# Patient Record
Sex: Female | Born: 1965 | Race: White | Hispanic: No | Smoking: Never smoker
Health system: Southern US, Community
[De-identification: ages and names within clinical notes are randomized; demographics above are authoritative.]

## PROBLEM LIST (undated history)

## (undated) DIAGNOSIS — Q059 Spina bifida, unspecified: Secondary | ICD-10-CM

## (undated) DIAGNOSIS — K219 Gastro-esophageal reflux disease without esophagitis: Secondary | ICD-10-CM

## (undated) DIAGNOSIS — F319 Bipolar disorder, unspecified: Secondary | ICD-10-CM

## (undated) DIAGNOSIS — I4891 Unspecified atrial fibrillation: Secondary | ICD-10-CM

## (undated) DIAGNOSIS — I10 Essential (primary) hypertension: Secondary | ICD-10-CM

---

## 2018-10-26 ENCOUNTER — Emergency Department (HOSPITAL_COMMUNITY)
Admission: EM | Admit: 2018-10-26 | Discharge: 2018-10-26 | Disposition: A | Discharge status: Home / Self Care | Payer: Medicare HMO | Attending: Emergency Medicine | Admitting: Emergency Medicine

## 2018-10-26 ENCOUNTER — Other Ambulatory Visit: Payer: Self-pay

## 2018-10-26 ENCOUNTER — Emergency Department (HOSPITAL_COMMUNITY): Payer: Medicare HMO

## 2018-10-26 ENCOUNTER — Encounter (HOSPITAL_COMMUNITY): Payer: Self-pay | Admitting: *Deleted

## 2018-10-26 DIAGNOSIS — R29810 Facial weakness: Secondary | ICD-10-CM | POA: Diagnosis present

## 2018-10-26 DIAGNOSIS — I10 Essential (primary) hypertension: Secondary | ICD-10-CM | POA: Diagnosis not present

## 2018-10-26 DIAGNOSIS — G510 Bell's palsy: Secondary | ICD-10-CM | POA: Diagnosis not present

## 2018-10-26 DIAGNOSIS — D649 Anemia, unspecified: Secondary | ICD-10-CM | POA: Diagnosis not present

## 2018-10-26 HISTORY — DX: Spina bifida, unspecified: Q05.9

## 2018-10-26 HISTORY — DX: Gastro-esophageal reflux disease without esophagitis: K21.9

## 2018-10-26 HISTORY — DX: Unspecified atrial fibrillation: I48.91

## 2018-10-26 HISTORY — DX: Essential (primary) hypertension: I10

## 2018-10-26 HISTORY — DX: Bipolar disorder, unspecified: F31.9

## 2018-10-26 LAB — I-STAT CHEM 8, ED
BUN: 35 mg/dL — ABNORMAL HIGH (ref 6–20)
Calcium, Ion: 1.08 mmol/L — ABNORMAL LOW (ref 1.15–1.40)
Chloride: 106 mmol/L (ref 98–111)
Creatinine, Ser: 1.7 mg/dL — ABNORMAL HIGH (ref 0.44–1.00)
Glucose, Bld: 118 mg/dL — ABNORMAL HIGH (ref 70–99)
HCT: 27 % — ABNORMAL LOW (ref 36.0–46.0)
Hemoglobin: 9.2 g/dL — ABNORMAL LOW (ref 12.0–15.0)
Potassium: 4.1 mmol/L (ref 3.5–5.1)
Sodium: 138 mmol/L (ref 135–145)
TCO2: 24 mmol/L (ref 22–32)

## 2018-10-26 LAB — COMPREHENSIVE METABOLIC PANEL
ALT: 12 U/L (ref 0–44)
AST: 15 U/L (ref 15–41)
Albumin: 3.4 g/dL — ABNORMAL LOW (ref 3.5–5.0)
Alkaline Phosphatase: 95 U/L (ref 38–126)
Anion gap: 11 (ref 5–15)
BUN: 37 mg/dL — ABNORMAL HIGH (ref 6–20)
CO2: 21 mmol/L — ABNORMAL LOW (ref 22–32)
Calcium: 8.4 mg/dL — ABNORMAL LOW (ref 8.9–10.3)
Chloride: 103 mmol/L (ref 98–111)
Creatinine, Ser: 1.68 mg/dL — ABNORMAL HIGH (ref 0.44–1.00)
GFR calc Af Amer: 40 mL/min — ABNORMAL LOW (ref >60)
GFR calc non Af Amer: 35 mL/min — ABNORMAL LOW (ref >60)
Glucose, Bld: 120 mg/dL — ABNORMAL HIGH (ref 70–99)
Potassium: 4 mmol/L (ref 3.5–5.1)
Sodium: 135 mmol/L (ref 135–145)
Total Bilirubin: 0.4 mg/dL (ref 0.3–1.2)
Total Protein: 6.8 g/dL (ref 6.5–8.1)

## 2018-10-26 LAB — DIFFERENTIAL
Abs Immature Granulocytes: 0.02 10*3/uL (ref 0.00–0.07)
Basophils Absolute: 0 10*3/uL (ref 0.0–0.1)
Basophils Relative: 1 %
Eosinophils Absolute: 0.4 10*3/uL (ref 0.0–0.5)
Eosinophils Relative: 5 %
Immature Granulocytes: 0 %
Lymphocytes Relative: 22 %
Lymphs Abs: 1.8 10*3/uL (ref 0.7–4.0)
Monocytes Absolute: 0.6 10*3/uL (ref 0.1–1.0)
Monocytes Relative: 7 %
Neutro Abs: 5.3 10*3/uL (ref 1.7–7.7)
Neutrophils Relative %: 65 %

## 2018-10-26 LAB — CBC
HCT: 27.9 % — ABNORMAL LOW (ref 36.0–46.0)
Hemoglobin: 8.7 g/dL — ABNORMAL LOW (ref 12.0–15.0)
MCH: 28.9 pg (ref 26.0–34.0)
MCHC: 31.2 g/dL (ref 30.0–36.0)
MCV: 92.7 fL (ref 80.0–100.0)
Platelets: 352 10*3/uL (ref 150–400)
RBC: 3.01 MIL/uL — ABNORMAL LOW (ref 3.87–5.11)
RDW: 15.6 % — ABNORMAL HIGH (ref 11.5–15.5)
WBC: 8.2 10*3/uL (ref 4.0–10.5)
nRBC: 0 % (ref 0.0–0.2)

## 2018-10-26 LAB — PROTIME-INR
INR: 0.9 (ref 0.8–1.2)
Prothrombin Time: 12.3 seconds (ref 11.4–15.2)

## 2018-10-26 LAB — APTT: aPTT: 30 seconds (ref 24–36)

## 2018-10-26 LAB — I-STAT BETA HCG BLOOD, ED (MC, WL, AP ONLY): I-stat hCG, quantitative: <5 m[IU]/mL (ref <5)

## 2018-10-26 MED ORDER — PREDNISONE 20 MG PO TABS: 60.0000 mg | ORAL_TABLET | Freq: Every day | ORAL | 0 refills | Status: AC

## 2018-10-26 MED ORDER — SODIUM CHLORIDE 0.9% FLUSH: 3.0000 mL | Freq: Once | INTRAVENOUS | Status: DC

## 2018-10-26 MED ORDER — VALACYCLOVIR HCL 1 G PO TABS: 1000.0000 mg | ORAL_TABLET | Freq: Three times a day (TID) | ORAL | 0 refills | Status: AC

## 2018-10-26 NOTE — Discharge Instructions (Addendum)
It was my pleasure taking care of you today!   As we discussed, using artificial tears 4 times a day to keep your eye moist well help.  You can also take the eyelid shut as we discussed during sleep with paper tape.  Take prednisone and valtrex as directed.   Please call your primary care doctor on Monday morning.  Let them know that your hemoglobin was 8.9.  Please arrange for a follow-up appointment.   Return to the emergency department for new or worsening symptoms, any additional concerns.

## 2018-10-26 NOTE — ED Notes (Signed)
Patient transported to MRI 

## 2018-10-26 NOTE — ED Notes (Signed)
Patient verbalizes understanding of discharge instructions. Opportunity for questioning and answers were provided. Armband removed by staff, pt discharged from ED.  

## 2018-10-26 NOTE — ED Triage Notes (Signed)
Pt reports waking up this am with right side facial droop. Unable to close right eye. Grips are equal, no other neuro deficits noted and denies headache.

## 2018-10-26 NOTE — ED Provider Notes (Signed)
[facility]   CSN: [id number] Arrival date & time: 10/26/18  1948    History   Chief Complaint Chief Complaint  Patient presents with  . Facial Droop    HPI Jackie Perez is a 53 y.o. female.     The history is provided by the patient and medical records. No language interpreter was used.   Jackie Perez is a 53 y.o. female  with a PMH of afib not on anticoagulation or aspirin, spina bifida, HTN who presents to the Emergency Department for evaluation after waking up this morning around 8:30 AM and noticing that she could not close her eye completely.  She then looked in the mirror and noticed that she had a crooked smile.  She denies any new numbness or weakness, but does state that her right leg is typically a little more weaker than her left at baseline due to her spina bifida.  She did not take any medications prior to arrival other than her typical home medications.  She has not had any headaches or dizziness.  No visual changes.  She did try to drink some water and noticed that a lot of it came out of the right side of her mouth.  She denies any trouble with her breathing or swallowing.   Past Medical History:  Diagnosis Date  . Atrial fibrillation (HCC)   . Bipolar depression (HCC)   . GERD (gastroesophageal reflux disease)   . Hypertension   . Spina bifida (HCC)     There are no active problems to display for this patient.   History reviewed. No pertinent surgical history.   OB History   No obstetric history on file.      Home Medications    Prior to Admission medications   Medication Sig Start Date End Date Taking? Authorizing Provider  predniSONE (DELTASONE) 20 MG tablet Take 3 tablets (60 mg total) by mouth daily. 10/26/18   Ward, Chase PicketJaime Pilcher, PA-C  valACYclovir (VALTREX) 1000 MG tablet Take 1 tablet (1,000 mg total) by mouth 3 (three) times daily for 7 days. 10/26/18 11/02/18  Ward, Chase PicketJaime Pilcher, PA-C     Family History History reviewed. No pertinent family history.  Social History Social History   Tobacco Use  . Smoking status: Never Smoker  . Smokeless tobacco: Never Used  Substance Use Topics  . Alcohol use: Not on file  . Drug use: Not on file     Allergies   Demerol [meperidine hcl] and Ivp dye [iodinated diagnostic agents]   Review of Systems Review of Systems  Neurological: Positive for facial asymmetry. Negative for dizziness, syncope, speech difficulty, weakness, light-headedness, numbness and headaches.  All other systems reviewed and are negative.    Physical Exam Updated Vital Signs BP 124/82 (BP Location: Right Arm)   Pulse 82   Temp 98.6 F (37 C) (Oral)   Resp 18   SpO2 99%   Physical Exam Vitals signs and nursing note reviewed.  Constitutional:      General: She is not in acute distress.    Appearance: She is well-developed.  HENT:     Head: Normocephalic and atraumatic.  Neck:     Musculoskeletal: Neck supple.  Cardiovascular:     Rate and Rhythm: Normal rate and regular rhythm.     Heart sounds: Normal heart sounds. No murmur.  Pulmonary:     Effort: Pulmonary effort is normal. No respiratory distress.     Breath sounds: Normal breath  sounds.  Abdominal:     General: There is no distension.     Palpations: Abdomen is soft.     Tenderness: There is no abdominal tenderness.  Skin:    General: Skin is warm and dry.  Neurological:     Mental Status: She is alert and oriented to person, place, and time.     Comments: Alert, oriented, thought content appropriate, able to give a coherent history. Speech is clear and goal oriented, able to follow commands.  Cranial Nerves:  II:  Peripheral visual fields grossly normal, pupils equal, round, reactive to light III, IV, VI: EOM intact bilaterally, ptosis not present V,VII: Right facial droop. Unable to close right eye tightly. Facial light touch sensation equal VIII: hearing grossly normal IX,  X: symmetric soft palate movement, uvula elevates symmetrically  XI: bilateral shoulder shrug symmetric and strong XII: midline tongue extension 5/5 muscle strength in bilateral upper and left lower extremity. 4/5 in RLE which she reports is baseline for her. Sensory to light touch normal in all four extremities.  No drift      ED Treatments / Results  Labs (all labs ordered are listed, but only abnormal results are displayed) Labs Reviewed  CBC - Abnormal; Notable for the following components:      Result Value   RBC 3.01 (*)    Hemoglobin 8.7 (*)    HCT 27.9 (*)    RDW 15.6 (*)    All other components within normal limits  COMPREHENSIVE METABOLIC PANEL - Abnormal; Notable for the following components:   CO2 21 (*)    Glucose, Bld 120 (*)    BUN 37 (*)    Creatinine, Ser 1.68 (*)    Calcium 8.4 (*)    Albumin 3.4 (*)    GFR calc non Af Amer 35 (*)    GFR calc Af Amer 40 (*)    All other components within normal limits  I-STAT CHEM 8, ED - Abnormal; Notable for the following components:   BUN 35 (*)    Creatinine, Ser 1.70 (*)    Glucose, Bld 118 (*)    Calcium, Ion 1.08 (*)    Hemoglobin 9.2 (*)    HCT 27.0 (*)    All other components within normal limits  PROTIME-INR  APTT  DIFFERENTIAL  I-STAT BETA HCG BLOOD, ED ([facility], [facility], [facility] ONLY)    EKG None  Radiology Mr Brain Wo Contrast  Result Date: 10/26/2018 CLINICAL DATA:  Right facial droop. EXAM: MRI HEAD WITHOUT CONTRAST TECHNIQUE: Multiplanar, multiecho pulse sequences of the brain and surrounding structures were obtained without intravenous contrast. COMPARISON:  None. FINDINGS: Brain: There is no evidence of acute infarct, intracranial hemorrhage, mass, midline shift, or extra-axial fluid collection. There is mildly age advanced cerebral atrophy. Scattered small foci of T2 hyperintensity in the cerebral white matter bilaterally are mildly advanced for age. 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 A small region of linear gliosis in the right frontal white matter could reflect an old catheter tract or old white matter lacune. Vascular: Major intracranial vascular flow voids are preserved. Skull and upper cervical spine: Unremarkable bone marrow signal. Sinuses/Orbits: Unremarkable orbits. Trace left mastoid effusion. Clear paranasal sinuses. Other: None. IMPRESSION: 1. No acute intracranial abnormality. 2. Mildly age advanced cerebral atrophy and cerebral white matter T2 signal changes, nonspecific though most often seen with chronic small vessel ischemia. Electronically Signed   By: Zenia Resides  Mosetta PuttGrady M.D.   On: 10/26/2018 21:45    Procedures Procedures (including critical care time)  Medications Ordered in ED Medications  sodium chloride flush (NS) 0.9 % injection 3 mL (0 mLs Intravenous Hold 10/26/18 2032)     Initial Impression / Assessment and Plan / ED Course  I have reviewed the triage vital signs and the nursing notes.  Pertinent labs & imaging results that were available during my care of the patient were reviewed by me and considered in my medical decision making (see chart for details).       Jackie Perez is a 53 y.o. female who presents to ED for right-sided facial droop and inability to close her right eye at 830 this morning.  Findings present on exam as well.  She reports history of spina bifida with baseline weakness in her right leg.  On my exam, she does have 4/5 muscle strength on the right and 5/5 in the left.  Clinically, exam does appear more consistent with Bell's palsy, however given her weakness and no charts for comparison of previous physical exams, will obtain MRI to rule out stroke.  Labs reviewed.  She does have hemoglobin of 8.7.  She states that she does have a history of anemia and has had to have several blood transfusions because of this.  She is unsure of her baseline and there is no laboratory values for  comparison.  Patient states that she does not feel symptomatic.  She has not felt weak, lightheaded.  She does have a primary care doctor and will call them on Monday morning to let them know a value of 8.7 for further work-up.  She understands the importance of returning to the emergency department should she begin to feel symptomatic.  Fortunately, MRI with no acute infarct.  Will treat for Bell's palsy with prednisone and Valtrex.  Eye care discussed as well.   Evaluation does not show pathology that would require ongoing emergent intervention or inpatient treatment.  Return precautions, primary follow-up and home care instructions discussed.  All questions answered.   Patient discussed with Dr. Jodi MourningZavitz who agrees with treatment plan.    Final Clinical Impressions(s) / ED Diagnoses   Final diagnoses:  Bell's palsy  Anemia, unspecified type    ED Discharge Orders         Ordered    predniSONE (DELTASONE) 20 MG tablet  Daily     10/26/18 2212    valACYclovir (VALTREX) 1000 MG tablet  3 times daily     10/26/18 2212           Ward, Chase PicketJaime Pilcher, PA-C 10/26/18 2221    Blane OharaZavitz, Joshua, MD 10/27/18 [phone]

## 2020-11-30 IMAGING — MR MRI HEAD WITHOUT CONTRAST
10 of 11 series · 43 of 48 positions shown · non-contrast
Comparison: None.

CLINICAL DATA: Right facial droop.

EXAM:
MRI HEAD WITHOUT CONTRAST
TECHNIQUE: Multiplanar, multiecho pulse sequences of the brain and surrounding
structures were obtained without intravenous contrast.

[Series 5: DWI · axial · 3.0mm · 0.88mm/px · z∈[-66,+80]mm · 10 of 100 slices shown (1 of 4)]
[im 1/100]
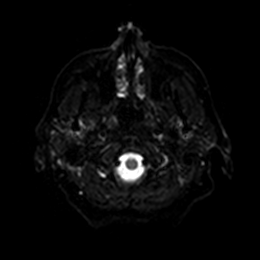
[im 12/100]
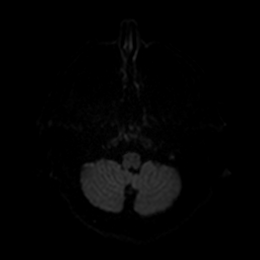
[im 23/100]
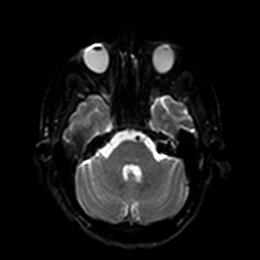
[im 34/100]
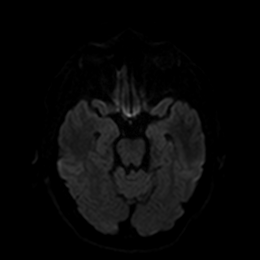
[im 45/100]
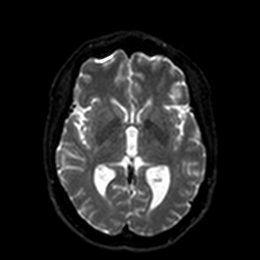
[im 56/100]
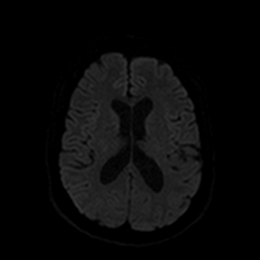
[im 67/100]
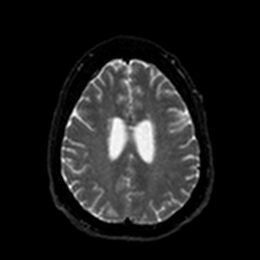
[im 78/100]
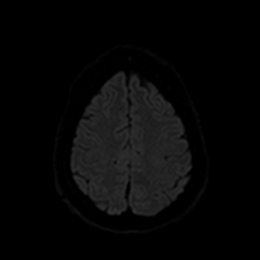
[im 89/100]
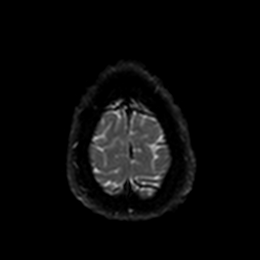
[im 100/100]
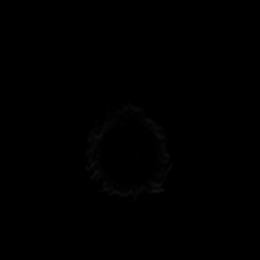

[Series 6: DWI · axial · 3.0mm · 0.88mm/px · z∈[-66,+80]mm · 5 of 50 slices shown (2 of 4)]
[im 1/50]
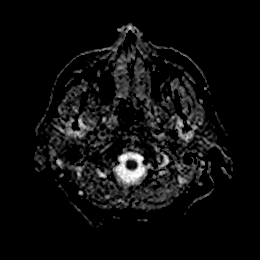
[im 13/50]
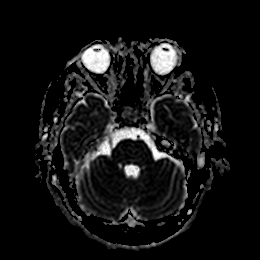
[im 25/50]
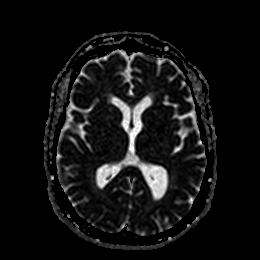
[im 37/50]
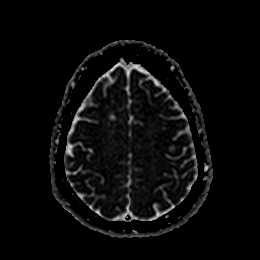
[im 50/50]
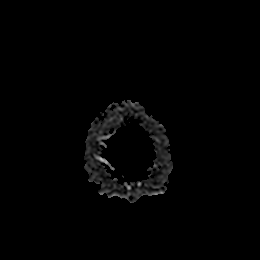

[Series 7: DWI · coronal · 4.0mm · 0.88mm/px · 6 of 68 slices shown (3 of 4)]
[im 1/68]
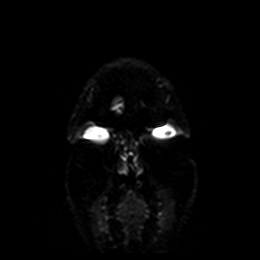
[im 14/68]
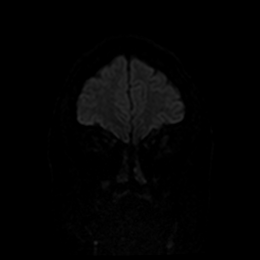
[im 27/68]
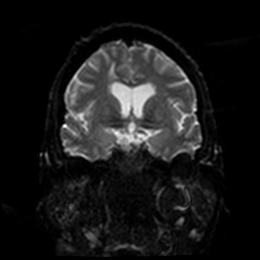
[im 41/68]
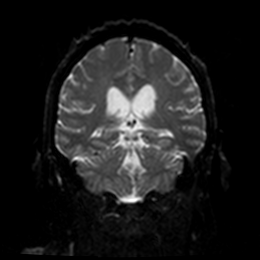
[im 54/68]
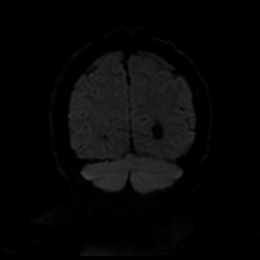
[im 68/68]
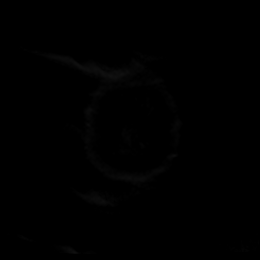

[Series 8: DWI · coronal · 4.0mm · 0.88mm/px · 3 of 34 slices shown (4 of 4)]
[im 1/34]
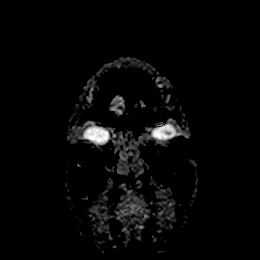
[im 17/34]
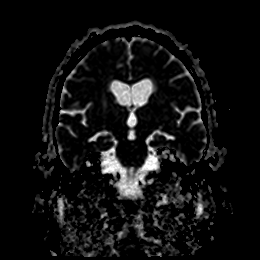
[im 34/34]
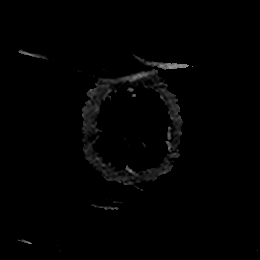

[Series 9: T1 · sagittal · 5.0mm · 0.75mm/px · 2 of 25 slices shown]
[im 1/25]
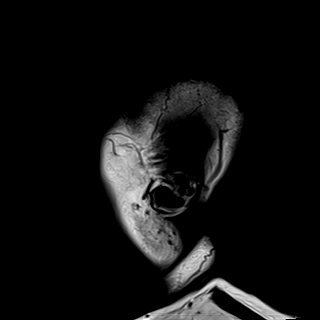
[im 25/25]
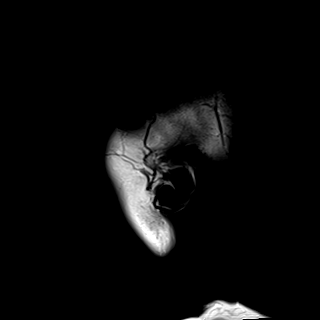

[Series 10: T2 · axial · 5.0mm · 0.72mm/px · z∈[-68,+81]mm · 2 of 26 slices shown (1 of 2)]
[im 1/26]
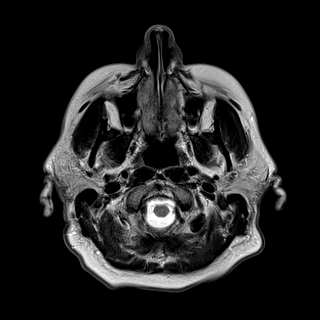
[im 26/26]
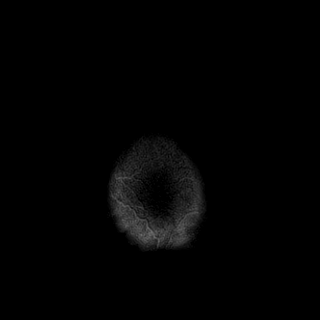

[Series 11: FLAIR · axial · 5.0mm · 0.45mm/px · z∈[-66,+83]mm · 2 of 26 slices shown]
[im 1/26]
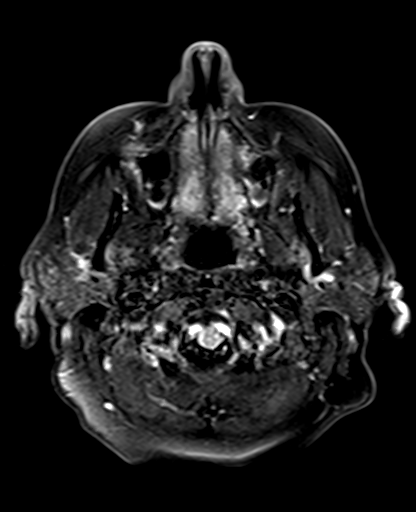
[im 26/26]
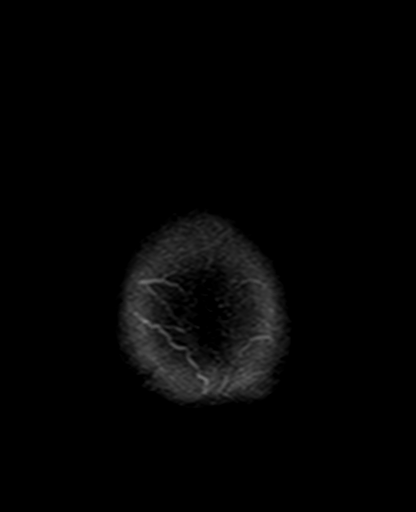

[Series 13: pha_images · axial · 3.0mm · 0.90mm/px · z∈[-81,+89]mm · 5 of 58 slices shown]
[im 1/58]
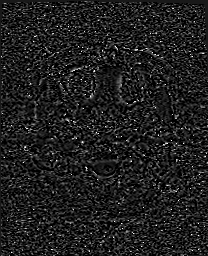
[im 15/58]
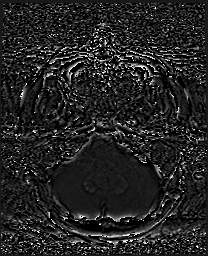
[im 29/58]
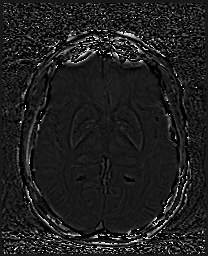
[im 43/58]
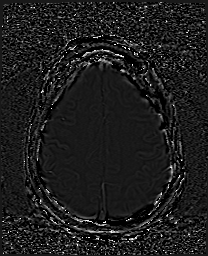
[im 58/58]
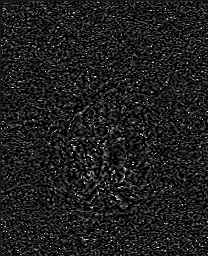

[Series 14: swi_images · axial · 3.0mm · 0.90mm/px · z∈[-81,+95]mm · 5 of 60 slices shown]
[im 1/60]
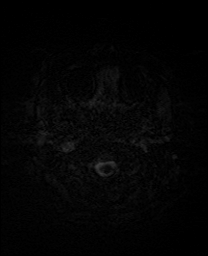
[im 15/60]
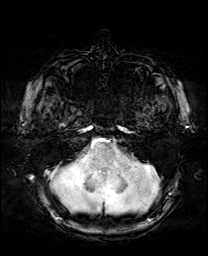
[im 30/60]
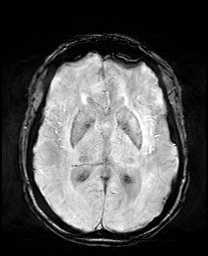
[im 45/60]
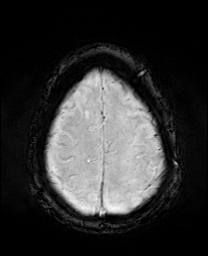
[im 60/60]
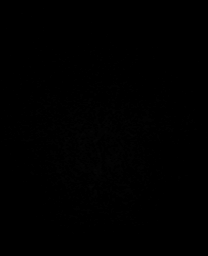

[Series 17: T2 · coronal · 5.0mm · 0.34mm/px · 3 of 29 slices shown (2 of 2)]
[im 1/29]
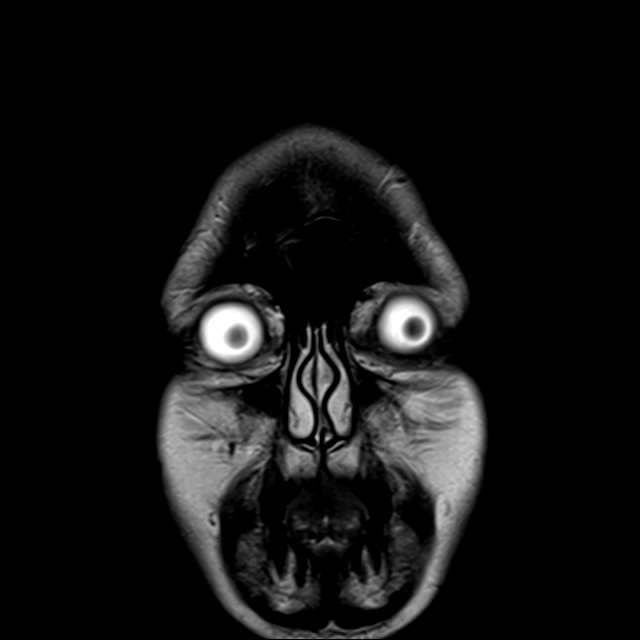
[im 15/29]
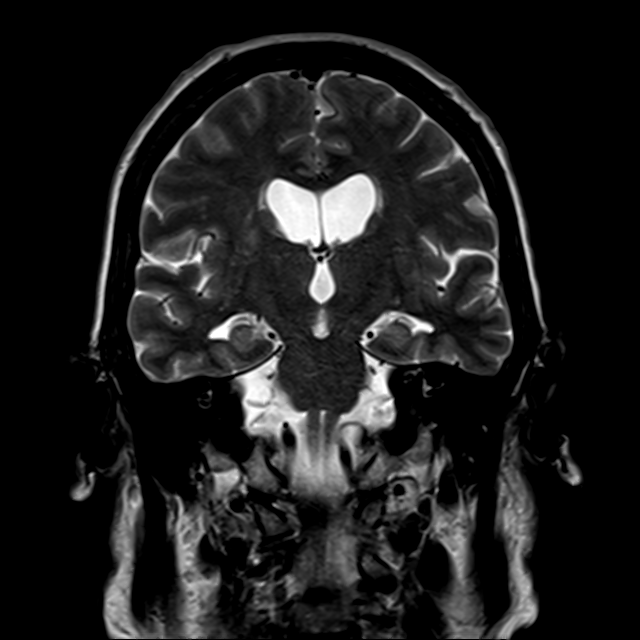
[im 29/29]
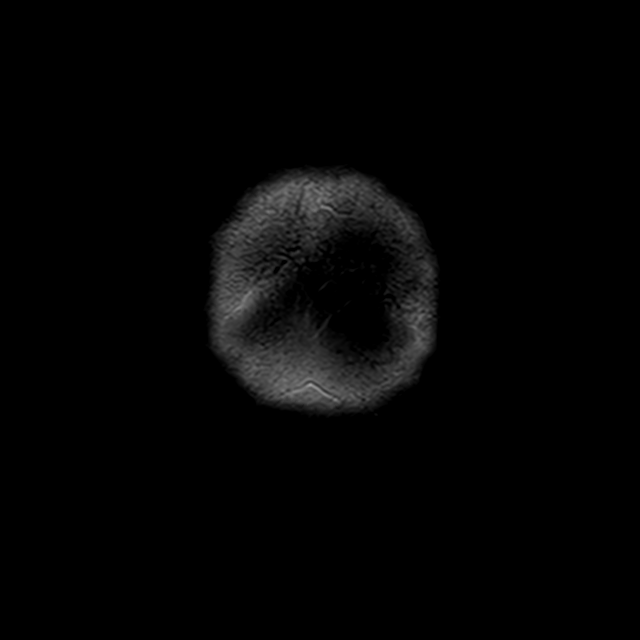

[43 of 48 positions shown; findings below may reference images not displayed]

FINDINGS: Brain: There is no evidence of acute infarct, intracranial
hemorrhage, mass, midline shift, or extra-axial fluid collection.
There is mildly age advanced cerebral atrophy. Scattered small foci
of T2 hyperintensity in the cerebral white matter bilaterally are
mildly advanced for age. There is a chronic lacunar infarct in the
periventricular white matter superior to the left caudate head. A
small region of linear gliosis in the right frontal white matter
could reflect an old catheter tract or old white matter lacune.

Vascular: Major intracranial vascular flow voids are preserved.

Skull and upper cervical spine: Unremarkable bone marrow signal.

Sinuses/Orbits: Unremarkable orbits. Trace left mastoid effusion.
Clear paranasal sinuses.

Other: None.
IMPRESSION: 1. No acute intracranial abnormality.
2. Mildly age advanced cerebral atrophy and cerebral white matter T2
signal changes, nonspecific though most often seen with chronic
small vessel ischemia.
